# Patient Record
Sex: Female | Born: 2005 | Race: White | Hispanic: No | Marital: Single | Smoking: Never smoker
Health system: Southern US, Community
[De-identification: ages and names within clinical notes are randomized; demographics above are authoritative.]

---
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
## 2025-03-13 ENCOUNTER — Encounter
